# Patient Record
Sex: Female | Born: 1970 | Race: Black or African American | Hispanic: No | Marital: Single | Smoking: Never smoker
Health system: Southern US, Community
[De-identification: ages and names within clinical notes are randomized; demographics above are authoritative.]

## PROBLEM LIST (undated history)

## (undated) DIAGNOSIS — E119 Type 2 diabetes mellitus without complications: Secondary | ICD-10-CM

## (undated) DIAGNOSIS — I10 Essential (primary) hypertension: Secondary | ICD-10-CM

## (undated) DIAGNOSIS — D259 Leiomyoma of uterus, unspecified: Secondary | ICD-10-CM

## (undated) DIAGNOSIS — F419 Anxiety disorder, unspecified: Secondary | ICD-10-CM

## (undated) HISTORY — PX: CHOLECYSTECTOMY: SHX55

---

## 2018-07-01 ENCOUNTER — Ambulatory Visit (INDEPENDENT_AMBULATORY_CARE_PROVIDER_SITE_OTHER): Payer: Non-veteran care

## 2018-07-01 ENCOUNTER — Encounter (HOSPITAL_COMMUNITY): Payer: Self-pay | Admitting: Emergency Medicine

## 2018-07-01 ENCOUNTER — Ambulatory Visit (HOSPITAL_COMMUNITY)
Admission: EM | Admit: 2018-07-01 | Discharge: 2018-07-01 | Disposition: A | Discharge status: Home / Self Care | Payer: Non-veteran care | Attending: Family Medicine | Admitting: Family Medicine

## 2018-07-01 DIAGNOSIS — R0789 Other chest pain: Secondary | ICD-10-CM

## 2018-07-01 HISTORY — DX: Type 2 diabetes mellitus without complications: E11.9

## 2018-07-01 HISTORY — DX: Essential (primary) hypertension: I10

## 2018-07-01 HISTORY — DX: Anxiety disorder, unspecified: F41.9

## 2018-07-01 HISTORY — DX: Leiomyoma of uterus, unspecified: D25.9

## 2018-07-01 LAB — POCT URINALYSIS DIP (DEVICE)
BILIRUBIN URINE: NEGATIVE
GLUCOSE, UA: NEGATIVE mg/dL
Hgb urine dipstick: NEGATIVE
KETONES UR: NEGATIVE mg/dL
Leukocytes, UA: NEGATIVE
Nitrite: NEGATIVE
Protein, ur: NEGATIVE mg/dL
Specific Gravity, Urine: 1.02 (ref 1.005–1.030)
Urobilinogen, UA: 1 mg/dL (ref 0.0–1.0)
pH: 7 (ref 5.0–8.0)

## 2018-07-01 MED ORDER — NAPROXEN 500 MG PO TABS: 500.0000 mg | ORAL_TABLET | Freq: Two times a day (BID) | ORAL | 0 refills | Status: DC

## 2018-07-01 NOTE — ED Triage Notes (Signed)
Pt c/o R rib pain for a few months, states the last week or so the pain has gotten worse. Non tender to palpation.

## 2018-07-01 NOTE — Discharge Instructions (Signed)
Your urine is normal today, your chest xray is normal today.  Please start naproxen twice a day, take with food, don't take additional ibuprofen with this.  Please follow up with your primary care provider for continued management and further evaluation if your symptoms persist.

## 2018-07-01 NOTE — ED Provider Notes (Signed)
[place]    CSN: [id number] Arrival date & time: 07/01/18  1052     History   Chief Complaint Chief Complaint  Patient presents with  . Rib pain    HPI Caitlin Brewer is a 47 y.o. female.   Jora presents with complaints of right sided rib pain which is worse with movement and deep inspiration. Has been ongoing for months now. Has seen her PCP who provided muscle relaxer and ordered an ultrasound. Patient states she missed the ultrasound and has not seen her PCP yet. She takes ibuprofen intermittently, last was yesterday, took 200mg . States she has a history of anxiety and feels the persistent pain is making her more anxious. Pain with movements of torso. She states she also seems to notice the pain more when she urinates. No frequency. No fevers. No cough. Does not smoke. No shortness of breath. Has an IUD, no other hormone therapy. No recent travel. No calf pain. No chemo or bed rest. Hx of anxiety, DM, htn, cholecystectomy.     ROS per HPI.      Past Medical History:  Diagnosis Date  . Anxiety   . Diabetes mellitus without complication ([place])   . Fibroid uterus   . Hypertension     There are no active problems to display for this patient.   Past Surgical History:  Procedure Laterality Date  . CHOLECYSTECTOMY      OB History   None      Home Medications    Prior to Admission medications   Medication Sig Start Date End Date Taking? Authorizing Provider  FLUoxetine (PROZAC) 40 MG capsule Take 60 mg by mouth daily.   Yes [provider]  hydrochlorothiazide (MICROZIDE) 12.5 MG capsule Take 12.5 mg by mouth daily.   Yes [provider]  METFORMIN HCL PO Take by mouth.   Yes [provider]  naproxen (NAPROSYN) 500 MG tablet Take 1 tablet (500 mg total) by mouth 2 (two) times daily. 07/01/18   Zigmund Gottron, NP    Family History No family history on file.  Social History Social History   Tobacco Use  . Smoking  status: Never Smoker  Substance Use Topics  . Alcohol use: Yes  . Drug use: Never     Allergies   Patient has no known allergies.   Review of Systems Review of Systems   Physical Exam Triage Vital Signs ED Triage Vitals  Enc Vitals Group     BP 07/01/18 1131 135/88     Pulse Rate 07/01/18 1129 75     Resp 07/01/18 1129 16     Temp 07/01/18 1129 98 F (36.7 C)     Temp src --      SpO2 07/01/18 1129 100 %     Weight --      Height --      Head Circumference --      Peak Flow --      Pain Score 07/01/18 1130 6     Pain Loc --      Pain Edu? --      Excl. in [place]? --    No data found.  Updated Vital Signs BP 135/88   Pulse 75   Temp 98 F (36.7 C)   Resp 16   SpO2 100%    Physical Exam  Constitutional: She is oriented to person, place, and time. She appears well-developed and well-nourished. No distress.  Cardiovascular: Normal rate, regular rhythm and normal  heart sounds.  Pulmonary/Chest: Effort normal and breath sounds normal. No respiratory distress. She has no wheezes.     She exhibits tenderness and bony tenderness.  Left posterior lateral and some anterolateral chest wall tenderness on palpation; lungs clear     Neurological: She is alert and oriented to person, place, and time.  Skin: Skin is warm and dry.     UC Treatments / Results  Labs (all labs ordered are listed, but only abnormal results are displayed) Labs Reviewed  POCT URINALYSIS DIP (DEVICE)    EKG None  Radiology Dg Ribs Unilateral W/chest Right  Result Date: 07/01/2018 CLINICAL DATA:  Right-sided chest wall pain under the breast for the past few months. No known injury. EXAM: RIGHT RIBS AND CHEST - 3+ VIEW COMPARISON:  None. FINDINGS: No fracture or other bone lesions are seen involving the ribs. There is no evidence of pneumothorax or pleural effusion. Both lungs are clear. Heart size and mediastinal contours are within normal limits. IMPRESSION: Negative. Electronically  Signed   By: Titus Dubin M.D.   On: 07/01/2018 12:26    Procedures Procedures (including critical care time)  Medications Ordered in UC Medications - No data to display  Initial Impression / Assessment and Plan / UC Course  I have reviewed the triage vital signs and the nursing notes.  Pertinent labs & imaging results that were available during my care of the patient were reviewed by me and considered in my medical decision making (see chart for details).     UA normal. Rib/chest films without acute findings. Palpable and reproducible chest pain with movement and palpation. Naproxen twice a day. Follow up with pcp for follow up as needed. Patient verbalized understanding and agreeable to plan.    Final Clinical Impressions(s) / UC Diagnoses   Final diagnoses:  Right-sided chest wall pain     Discharge Instructions     Your urine is normal today, your chest xray is normal today.  Please start naproxen twice a day, take with food, don't take additional ibuprofen with this.  Please follow up with your primary care provider for continued management and further evaluation if your symptoms persist.      ED Prescriptions    Medication Sig Dispense Auth. Provider   naproxen (NAPROSYN) 500 MG tablet Take 1 tablet (500 mg total) by mouth 2 (two) times daily. 30 tablet Zigmund Gottron, NP     Controlled Substance Prescriptions [place] Controlled Substance Registry consulted? Not Applicable   Zigmund Gottron, NP 07/01/18 1232

## 2019-12-18 IMAGING — DX DG RIBS W/ CHEST 3+V*R*
3 series · 3 of 3 positions shown · non-contrast
Comparison: None.

CLINICAL DATA: Right-sided chest wall pain under the breast for the
past few months. No known injury.

EXAM:
RIGHT RIBS AND CHEST - 3+ VIEW

[chest pa]
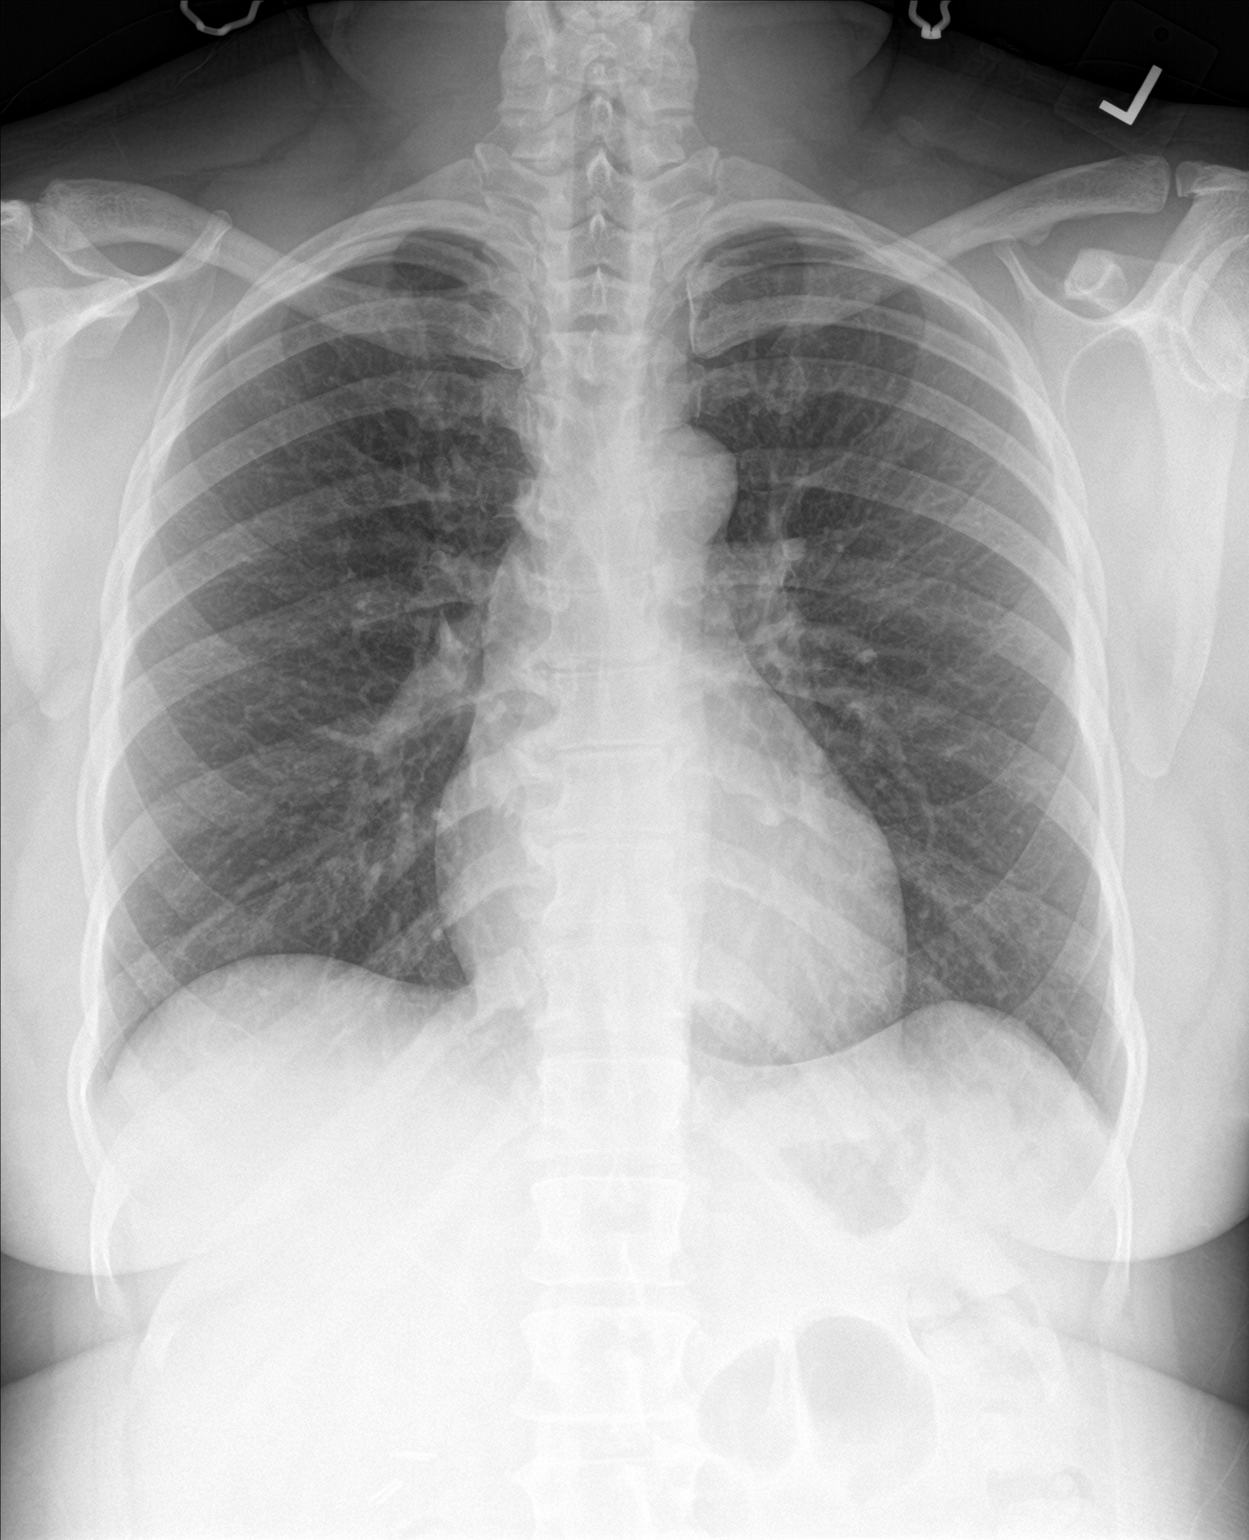

[rib pa]
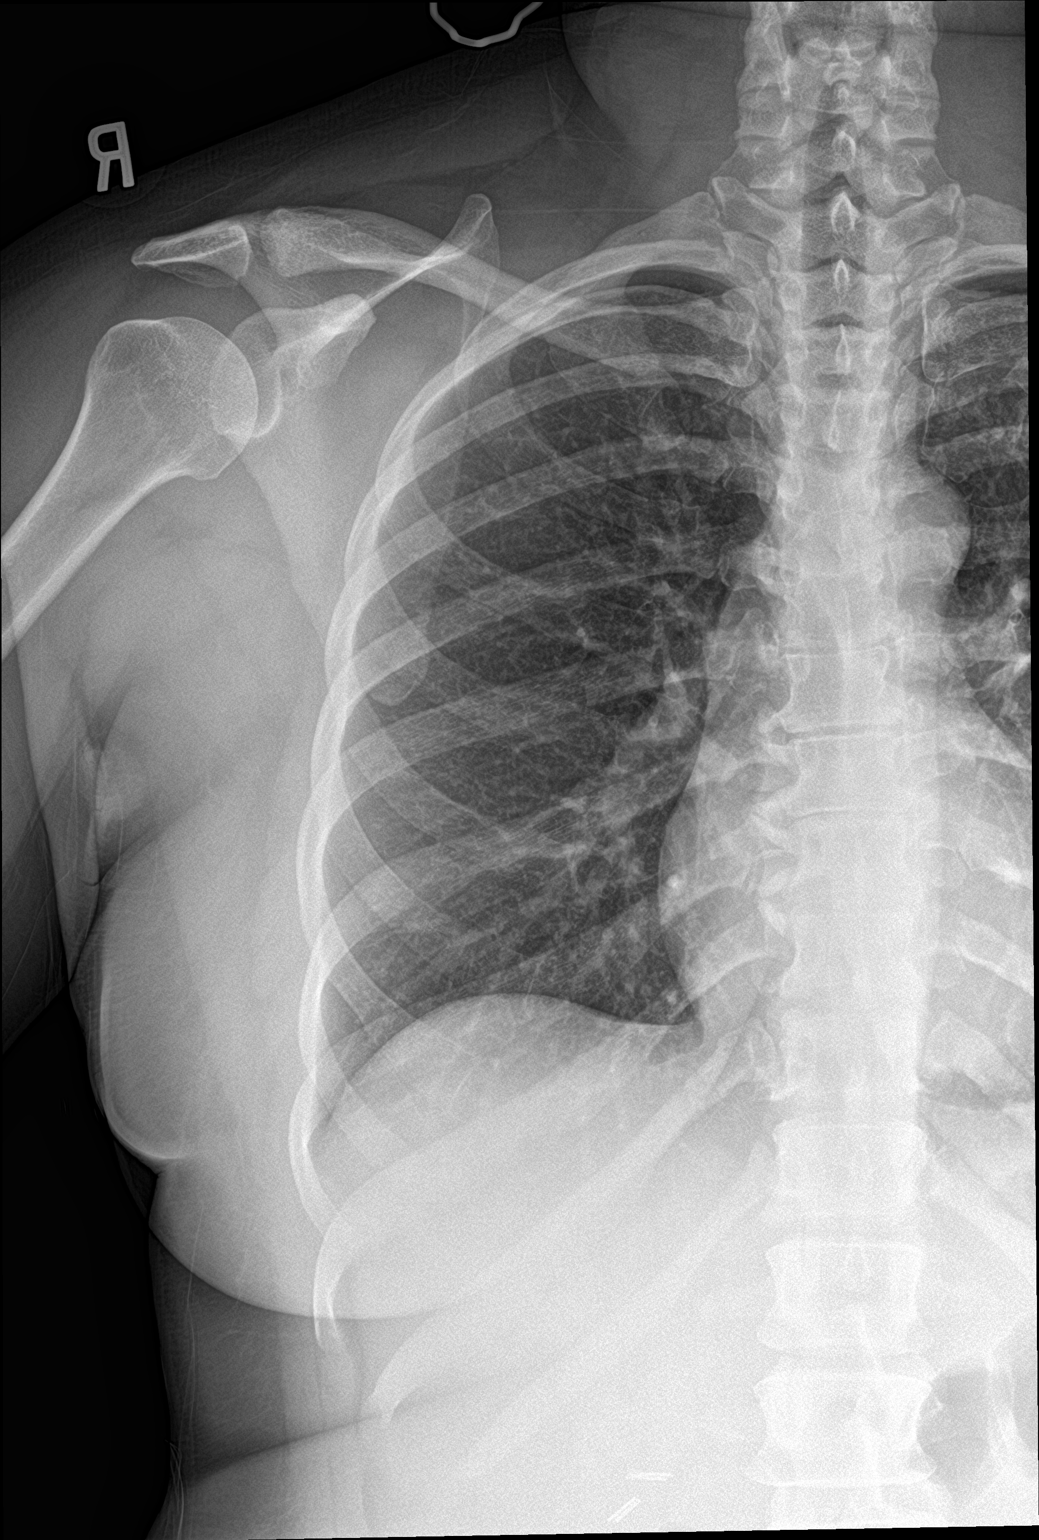

[rib obl]
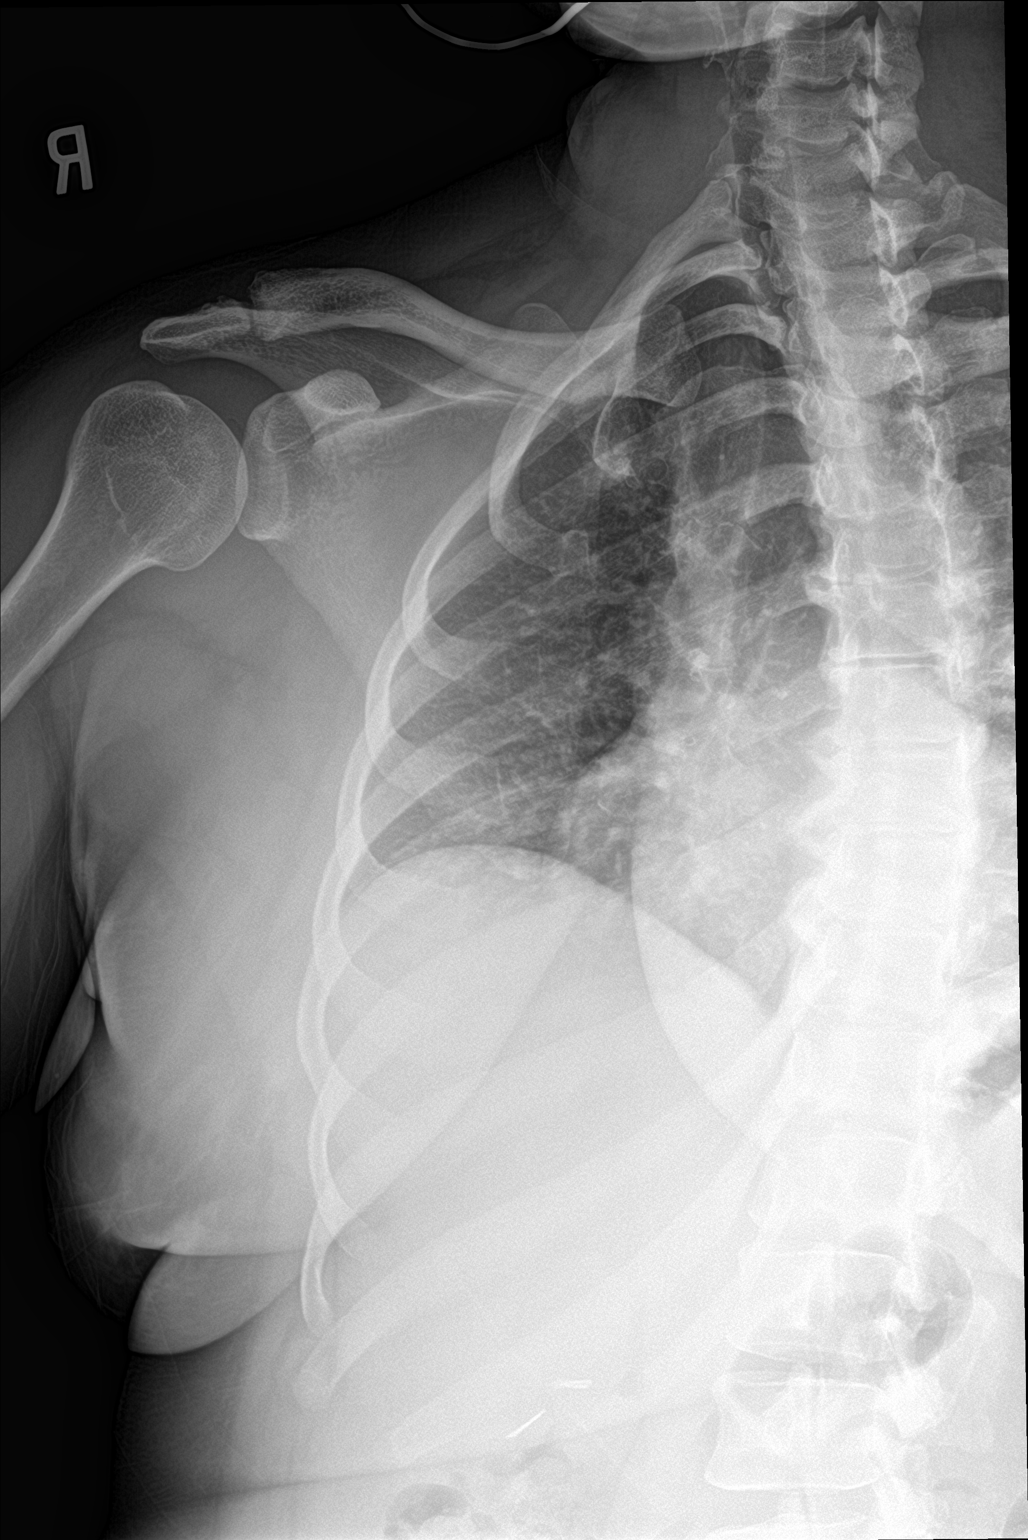

[3 of 3 positions shown; findings below may reference images not displayed]

FINDINGS: No fracture or other bone lesions are seen involving the ribs. There
is no evidence of pneumothorax or pleural effusion. Both lungs are
clear. Heart size and mediastinal contours are within normal limits.
IMPRESSION: Negative.

## 2021-07-27 ENCOUNTER — Ambulatory Visit (HOSPITAL_COMMUNITY): Payer: Non-veteran care

## 2021-07-28 ENCOUNTER — Other Ambulatory Visit: Payer: Self-pay

## 2021-07-28 ENCOUNTER — Encounter (HOSPITAL_COMMUNITY): Payer: Self-pay

## 2021-07-28 ENCOUNTER — Ambulatory Visit (HOSPITAL_COMMUNITY)
Admission: RE | Admit: 2021-07-28 | Discharge: 2021-07-28 | Disposition: A | Discharge status: Home / Self Care | Payer: Non-veteran care | Source: Ambulatory Visit

## 2021-07-28 VITALS — BP 169/110 | HR 85 | Temp 98.3°F | Resp 18

## 2021-07-28 DIAGNOSIS — M25562 Pain in left knee: Secondary | ICD-10-CM | POA: Diagnosis not present

## 2021-07-28 MED ORDER — ETODOLAC 400 MG PO TABS: 400.0000 mg | ORAL_TABLET | Freq: Two times a day (BID) | ORAL | 0 refills | Status: AC

## 2021-07-28 MED ORDER — ETODOLAC 400 MG PO TABS: 400.0000 mg | ORAL_TABLET | Freq: Two times a day (BID) | ORAL | 0 refills | Status: DC

## 2021-07-28 NOTE — ED Provider Notes (Signed)
[place] [facility]    CSN: [id number] Arrival date & time: 07/28/21  1117      History   Chief Complaint Chief Complaint  Patient presents with   Appointment    1200   Back Pain   Knee Pain    HPI Caitlin Brewer is a 50 y.o. female.   HPI  Knee Pain: Patient reports that for many years she has had chronic low back pain for which she is seen by the [place].  She reports that now she is having left knee pain for which she has had since around January.  Symptoms started after she received a COVID vaccination booster.  She states that symptoms had been off and on since this time but this week they have been more constant.  She states that she has some swelling and pain of the superior and anterior portion of the left knee.  No known injury.  Hurts most when she walks up and down steps.  She has tried ibuprofen, Mobic and muscle relaxers without much relief.  She denies any calf pain, chest pain or shortness of breath.  Past Medical History:  Diagnosis Date   Anxiety    Diabetes mellitus without complication ([place])    Fibroid uterus    Hypertension     There are no problems to display for this patient.   Past Surgical History:  Procedure Laterality Date   CHOLECYSTECTOMY      OB History   No obstetric history on file.      Home Medications    Prior to Admission medications   Medication Sig Start Date End Date Taking? Authorizing Provider  amLODipine (NORVASC) 10 MG tablet TAKE ONE TABLET BY MOUTH DAILY FOR BLOOD PRESSURE. 11/18/20  Yes [provider]  fluconazole (DIFLUCAN) 200 MG tablet TAKE ONE TABLET BY MOUTH ONCE A DAY FOR 7 DAYS, THEN TAKE ONE TABLET EVERY 7 DAYS FOR 28 DAYS DO NOT USE ATORVASTATIN ON DAYS USING THIS 12/20/20  Yes [provider]  ibuprofen (ADVIL) 600 MG tablet TAKE ONE TABLET BY MOUTH THREE TIMES A DAY AS NEEDED (TAKE WITH FOOD) 11/18/20  Yes [provider]  metFORMIN (GLUCOPHAGE) 500 MG tablet TAKE ONE TABLET BY MOUTH  TWICE A DAY FOR DIABETES (ANNUAL KIDNEY FUNCTION TESTING IS NEEDED) 12/28/20  Yes [provider]  FLUoxetine (PROZAC) 40 MG capsule Take 60 mg by mouth daily.    [provider]  hydrochlorothiazide (MICROZIDE) 12.5 MG capsule Take 12.5 mg by mouth daily.    [provider]  METFORMIN HCL PO Take by mouth.    [provider]  naproxen (NAPROSYN) 500 MG tablet Take 1 tablet (500 mg total) by mouth 2 (two) times daily. 07/01/18   Zigmund Gottron, NP    Family History History reviewed. No pertinent family history.  Social History Social History   Tobacco Use   Smoking status: Never  Substance Use Topics   Alcohol use: Yes   Drug use: Never     Allergies   Patient has no known allergies.   Review of Systems Review of Systems  As stated above in HPI Physical Exam Triage Vital Signs ED Triage Vitals  Enc Vitals Group     BP 07/28/21 1148 (!) 169/110     Pulse Rate 07/28/21 1148 85     Resp 07/28/21 1148 18     Temp 07/28/21 1148 98.3 F (36.8 C)     Temp Source 07/28/21 1148 Oral  SpO2 07/28/21 1148 98 %     Weight --      Height --      Head Circumference --      Peak Flow --      Pain Score 07/28/21 1147 8     Pain Loc --      Pain Edu? --      Excl. in [place]? --    No data found.  Updated Vital Signs BP (!) 169/110 (BP Location: Left Arm)   Pulse 85   Temp 98.3 F (36.8 C) (Oral)   Resp 18   LMP  (Within Weeks) Comment: 2 weeks  SpO2 98%   Physical Exam Vitals and nursing note reviewed.  Constitutional:      General: She is not in acute distress.    Appearance: Normal appearance. She is not ill-appearing, toxic-appearing or diaphoretic.  Cardiovascular:     Pulses: Normal pulses.  Musculoskeletal:        General: No swelling, deformity or signs of injury. Normal range of motion.     Right knee: Normal.     Left knee: Swelling (mild) and effusion (mild) present. No deformity, bony tenderness or crepitus. Normal range  of motion. Tenderness (see diagram) present. No LCL laxity, MCL laxity, ACL laxity or PCL laxity.Normal alignment, normal meniscus and normal patellar mobility. Normal pulse.     Instability Tests: Anterior drawer test negative. Posterior drawer test negative. Anterior Lachman test negative. Medial McMurray test negative and lateral McMurray test negative.     Right lower leg: No edema.     Left lower leg: No edema.       Legs:  Skin:    General: Skin is warm.  Neurological:     Mental Status: She is alert.     UC Treatments / Results  Labs (all labs ordered are listed, but only abnormal results are displayed) Labs Reviewed - No data to display  EKG   Radiology No results found.  Procedures Procedures (including critical care time)  Medications Ordered in UC Medications - No data to display  Initial Impression / Assessment and Plan / UC Course  I have reviewed the triage vital signs and the nursing notes.  Pertinent labs & imaging results that were available during my care of the patient were reviewed by me and considered in my medical decision making (see chart for details).     New.  Wide differential.  For now we will treat with a knee brace, NSAID and RICE.  Recommended orthopedic follow-up and evaluation which she is agreeable with. Discussed red flag signs and symptoms.  Final Clinical Impressions(s) / UC Diagnoses   Final diagnoses:  None   Discharge Instructions   None    ED Prescriptions   None    PDMP not reviewed this encounter.   Hughie Closs, [place] 07/28/21 1230

## 2021-07-28 NOTE — ED Triage Notes (Signed)
Pt reports lower back pain and left knee pain. States she has arthritis.

## 2024-03-11 ENCOUNTER — Emergency Department (HOSPITAL_COMMUNITY)
Admission: EM | Admit: 2024-03-11 | Discharge: 2024-03-12 | Discharge status: Against Medical Advice | Payer: Self-pay | Attending: Emergency Medicine | Admitting: Emergency Medicine

## 2024-03-11 ENCOUNTER — Encounter (HOSPITAL_COMMUNITY): Payer: Self-pay

## 2024-03-11 ENCOUNTER — Emergency Department (HOSPITAL_COMMUNITY): Payer: Self-pay

## 2024-03-11 DIAGNOSIS — Z5321 Procedure and treatment not carried out due to patient leaving prior to being seen by health care provider: Secondary | ICD-10-CM | POA: Insufficient documentation

## 2024-03-11 DIAGNOSIS — R42 Dizziness and giddiness: Secondary | ICD-10-CM | POA: Insufficient documentation

## 2024-03-11 DIAGNOSIS — H538 Other visual disturbances: Secondary | ICD-10-CM | POA: Insufficient documentation

## 2024-03-11 DIAGNOSIS — R002 Palpitations: Secondary | ICD-10-CM | POA: Insufficient documentation

## 2024-03-11 DIAGNOSIS — I10 Essential (primary) hypertension: Secondary | ICD-10-CM | POA: Insufficient documentation

## 2024-03-11 LAB — BASIC METABOLIC PANEL WITH GFR
Anion gap: 12 (ref 5–15)
BUN: 11 mg/dL (ref 6–20)
CO2: 26 mmol/L (ref 22–32)
Calcium: 9.6 mg/dL (ref 8.9–10.3)
Chloride: 100 mmol/L (ref 98–111)
Creatinine, Ser: 1.02 mg/dL — ABNORMAL HIGH (ref 0.44–1.00)
GFR, Estimated: >60 mL/min (ref >60)
Glucose, Bld: 98 mg/dL (ref 70–99)
Potassium: 3 mmol/L — ABNORMAL LOW (ref 3.5–5.1)
Sodium: 138 mmol/L (ref 135–145)

## 2024-03-11 LAB — CBC WITH DIFFERENTIAL/PLATELET
Abs Immature Granulocytes: 0.01 10*3/uL (ref 0.00–0.07)
Basophils Absolute: 0 10*3/uL (ref 0.0–0.1)
Basophils Relative: 0 %
Eosinophils Absolute: 0.1 10*3/uL (ref 0.0–0.5)
Eosinophils Relative: 2 %
HCT: 39.1 % (ref 36.0–46.0)
Hemoglobin: 13.5 g/dL (ref 12.0–15.0)
Immature Granulocytes: 0 %
Lymphocytes Relative: 36 %
Lymphs Abs: 1.9 10*3/uL (ref 0.7–4.0)
MCH: 29 pg (ref 26.0–34.0)
MCHC: 34.5 g/dL (ref 30.0–36.0)
MCV: 83.9 fL (ref 80.0–100.0)
Monocytes Absolute: 0.4 10*3/uL (ref 0.1–1.0)
Monocytes Relative: 7 %
Neutro Abs: 2.9 10*3/uL (ref 1.7–7.7)
Neutrophils Relative %: 55 %
Platelets: 350 10*3/uL (ref 150–400)
RBC: 4.66 MIL/uL (ref 3.87–5.11)
RDW: 12.8 % (ref 11.5–15.5)
WBC: 5.3 10*3/uL (ref 4.0–10.5)
nRBC: 0 % (ref 0.0–0.2)

## 2024-03-11 LAB — TROPONIN I (HIGH SENSITIVITY)
Troponin I (High Sensitivity): 4 ng/L (ref <18)
Troponin I (High Sensitivity): 6 ng/L (ref <18)

## 2024-03-11 NOTE — ED Notes (Signed)
Pt left due to excessive wait time.

## 2024-03-11 NOTE — ED Triage Notes (Signed)
 Sent by PCP for heart palpitations, headache for 3 days along with dizziness, blurry vision

## 2024-03-11 NOTE — ED Provider Triage Note (Signed)
 Emergency Medicine Provider Triage Evaluation Note  Caitlin Brewer , a 53 y.o. female  was evaluated in triage.  Pt complains of headache, hypertension. Notes same began 3 days ago. Called her pcp who told her to come to the Er for evaluation. No history of similar. Complaint with anti-hypertensive meds. Notes feeling like her "equilibrium is off" and she is "seeing spots." Headache is frontal. Took NSAIDs with no improvement. Symptoms have been persistent and unchanged x 3 days.  Review of Systems  Positive:  Negative:   Physical Exam  BP (!) 192/114 (BP Location: Right Arm)   Pulse 86   Temp 98.4 F (36.9 C)   Resp 16   SpO2 99%  Gen:   Awake, no distress   Resp:  Normal effort  MSK:   Moves extremities without difficulty  Other:  No focal neurologic deficits  Medical Decision Making  Medically screening exam initiated at 3:29 PM.  Appropriate orders placed.  Caitlin Brewer was informed that the remainder of the evaluation will be completed by another provider, this initial triage assessment does not replace that evaluation, and the importance of remaining in the ED until their evaluation is complete.     Sherra Dk, PA-C 03/11/24 1533

## 2024-05-06 ENCOUNTER — Telehealth: Payer: Self-pay

## 2024-05-06 NOTE — Telephone Encounter (Signed)
 CCNP has worked with this client since by phone since receiving a referral on 03-18-24 from [facility] case manager regarding health concerns related to mold in the  house. Client was concerned that mold was causing her and her adult children health issues . She had attempted to work with the [facility] but the issues had not been resolved. She had concerns about headaches, arthritis ,respiratory issues ,skin irritations. CCNP requested pictures  of rooms where mold was present ,which the client sent .  TC was made to Health dept to determine what could be done to learn there are no rules /enforcement in place to deal  with mold . Aaron AasCCNP was  directed to make a complaint with the city code enforcement office ,which was done . CCNP also made contact with the  Landlord  of the property whom immediately wanted to correct issues of concern . Pictures were sent to him and he made contact with the client  to correct the issues after several conversations.[employer] Look on home had been replaced 3/25. Repairs to inside made . Follow up text and  calls rendered action and resolution per client in a follow up call today client states things are ok ,thanked CCNP for the assistance.Aaron Aas

## 2024-09-09 ENCOUNTER — Encounter: Payer: Self-pay | Admitting: Gastroenterology

## 2024-10-29 ENCOUNTER — Ambulatory Visit: Admitting: Gastroenterology

## 2024-10-29 NOTE — Progress Notes (Deleted)
 Chief Complaint:other fecal abnormalities  Primary GI Doctor:***  HPI:  Patient is a  53  year old female patient with past medical history of anxiety, DM, and hypertension*****who was referred to me by [facility] on 09/05/24 for a evaluation of other fecal abnormalities  .    Interval History  Patient admits/denies GERD Patient admits/denies dysphagia Patient admits/denies nausea, vomiting, or weight loss  Patient admits/denies altered bowel habits Patient admits/denies abdominal pain Patient admits/denies rectal bleeding   Denies/Admits alcohol Denies/Admits smoking Denies/Admits NSAID use. Denies/Admits they are on blood thinners.  Patients last colonoscopy Patients last EGD  Surgical history:  Patient's family history includes  Wt Readings from Last 3 Encounters:  No data found for Wt      Past Medical History:  Diagnosis Date   Anxiety    Diabetes mellitus without complication ([facility])    Fibroid uterus    Hypertension     Past Surgical History:  Procedure Laterality Date   CHOLECYSTECTOMY      Current Outpatient Medications  Medication Sig Dispense Refill   amLODipine (NORVASC) 10 MG tablet TAKE ONE TABLET BY MOUTH DAILY FOR BLOOD PRESSURE.     etodolac  (LODINE ) 400 MG tablet Take 1 tablet (400 mg total) by mouth 2 (two) times daily. 14 tablet 0   fluconazole (DIFLUCAN) 200 MG tablet TAKE ONE TABLET BY MOUTH ONCE A DAY FOR 7 DAYS, THEN TAKE ONE TABLET EVERY 7 DAYS FOR 28 DAYS DO NOT USE ATORVASTATIN ON DAYS USING THIS     FLUoxetine (PROZAC) 40 MG capsule Take 60 mg by mouth daily.     hydrochlorothiazide (MICROZIDE) 12.5 MG capsule Take 12.5 mg by mouth daily.     metFORMIN (GLUCOPHAGE) 500 MG tablet TAKE ONE TABLET BY MOUTH TWICE A DAY FOR DIABETES (ANNUAL KIDNEY FUNCTION TESTING IS NEEDED)     METFORMIN HCL PO Take by mouth.     No current facility-administered medications for this visit.    Allergies as of 10/29/2024   (No Known  Allergies)    No family history on file.  Review of Systems:    Constitutional: No weight loss, fever, chills, weakness or fatigue HEENT: Eyes: No change in vision               Ears, Nose, Throat:  No change in hearing or congestion Skin: No rash or itching Cardiovascular: No chest pain, chest pressure or palpitations   Respiratory: No SOB or cough Gastrointestinal: See HPI and otherwise negative Genitourinary: No dysuria or change in urinary frequency Neurological: No headache, dizziness or syncope Musculoskeletal: No new muscle or joint pain Hematologic: No bleeding or bruising Psychiatric: No history of depression or anxiety    Physical Exam:  Vital signs: There were no vitals taken for this visit.  Constitutional:   Pleasant *** female/female appears to be in NAD, Well developed, Well nourished, alert and cooperative Eyes:   PEERL, EOMI. No icterus. Conjunctiva pink. Neck:  Supple Throat: Oral cavity and pharynx without inflammation, swelling or lesion.  Respiratory: Respirations even and unlabored. Lungs clear to auscultation bilaterally.   No wheezes, crackles, or rhonchi.  Cardiovascular: Normal S1, S2. Regular rate and rhythm. No peripheral edema, cyanosis or pallor.  Gastrointestinal:  Soft, nondistended, nontender. No rebound or guarding. Normal bowel sounds. No appreciable masses or hepatomegaly. Rectal:  Not performed.  Anoscopy: Msk:  Symmetrical without gross deformities. Without edema, no deformity or joint abnormality.  Neurologic:  Alert and  oriented x4;  grossly normal  neurologically.  Skin:   Dry and intact without significant lesions or rashes.  RELEVANT LABS AND IMAGING: CBC    Latest Ref Rng & Units 03/11/2024    3:08 PM  CBC  WBC 4.0 - 10.5 K/uL 5.3   Hemoglobin 12.0 - 15.0 g/dL 86.4   Hematocrit 63.9 - 46.0 % 39.1   Platelets 150 - 400 K/uL 350      CMP     Latest Ref Rng & Units 03/11/2024    3:08 PM  CMP  Glucose 70 - 99 mg/dL 98   BUN 6  - 20 mg/dL 11   Creatinine 9.55 - 1.00 mg/dL 8.97   Sodium 864 - 854 mmol/L 138   Potassium 3.5 - 5.1 mmol/L 3.0   Chloride 98 - 111 mmol/L 100   CO2 22 - 32 mmol/L 26   Calcium 8.9 - 10.3 mg/dL 9.6      No results found for: TSH   Assessment: 1. ***  Plan: 1. ***   Thank you for the courtesy of this consult. Please call me with any questions or concerns.   Kael Keetch, FNP-C [facility] 10/29/2024, 7:24 AM  Cc: [facility]
